# Patient Record
Sex: Male | Born: 1970
Health system: Southern US, Community
[De-identification: ages and names within clinical notes are randomized; demographics above are authoritative.]

## PROBLEM LIST (undated history)

## (undated) DIAGNOSIS — M549 Dorsalgia, unspecified: Secondary | ICD-10-CM

## (undated) HISTORY — PX: KNEE ARTHROSCOPY W/ ACL RECONSTRUCTION AND PATELLA GRAFT: SHX1861

## (undated) HISTORY — PX: LUMBAR DISC SURGERY: SHX700

---

## 2000-01-01 ENCOUNTER — Encounter: Payer: Self-pay | Admitting: Neurosurgery

## 2000-01-01 ENCOUNTER — Ambulatory Visit (HOSPITAL_COMMUNITY): Admission: RE | Admit: 2000-01-01 | Discharge: 2000-01-01 | Payer: Self-pay | Admitting: Neurosurgery

## 2000-03-06 ENCOUNTER — Encounter: Admission: RE | Admit: 2000-03-06 | Discharge: 2000-03-20 | Payer: Self-pay | Admitting: Anesthesiology

## 2000-09-04 ENCOUNTER — Encounter: Admission: RE | Admit: 2000-09-04 | Discharge: 2000-12-03 | Payer: Self-pay | Admitting: Anesthesiology

## 2003-06-14 ENCOUNTER — Encounter: Payer: Self-pay | Admitting: Neurosurgery

## 2003-06-14 ENCOUNTER — Ambulatory Visit (HOSPITAL_COMMUNITY): Admission: RE | Admit: 2003-06-14 | Discharge: 2003-06-15 | Payer: Self-pay | Admitting: Neurosurgery

## 2008-11-30 ENCOUNTER — Emergency Department (HOSPITAL_COMMUNITY): Admission: EM | Admit: 2008-11-30 | Discharge: 2008-12-01 | Payer: Self-pay | Admitting: Emergency Medicine

## 2009-07-07 IMAGING — CR DG FINGER LITTLE 2+V*L*
3 series · 3 of 3 positions shown · non-contrast
Comparison: Left small finger x-rays earlier 5160 hours.

CLINICAL DATA: Reduction of dislocated PIP joint of small finger.

LEFT LITTLE FINGER 2+V [DATE]/4001 4434 hours:

[x finger pa left]
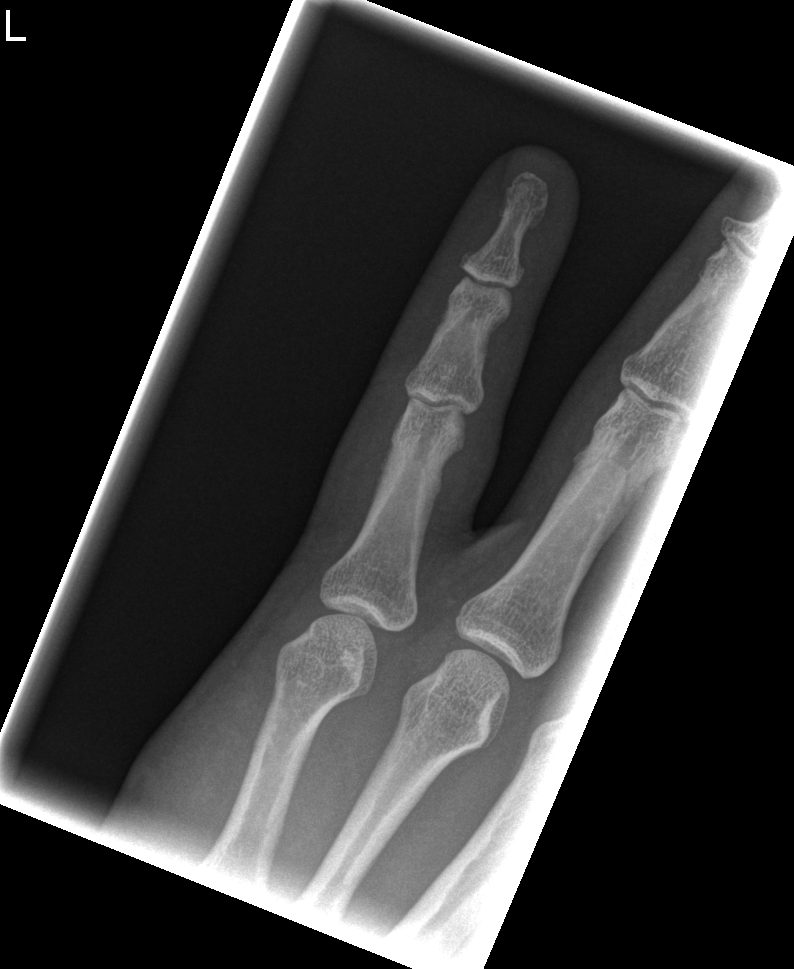

[x finger obl. left]
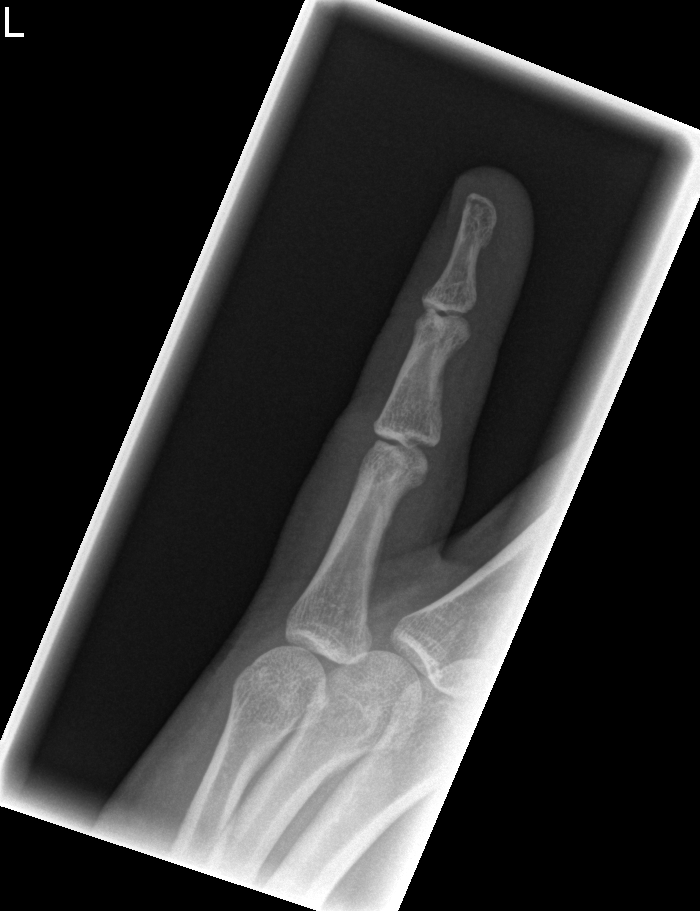

[x finger lateral left]
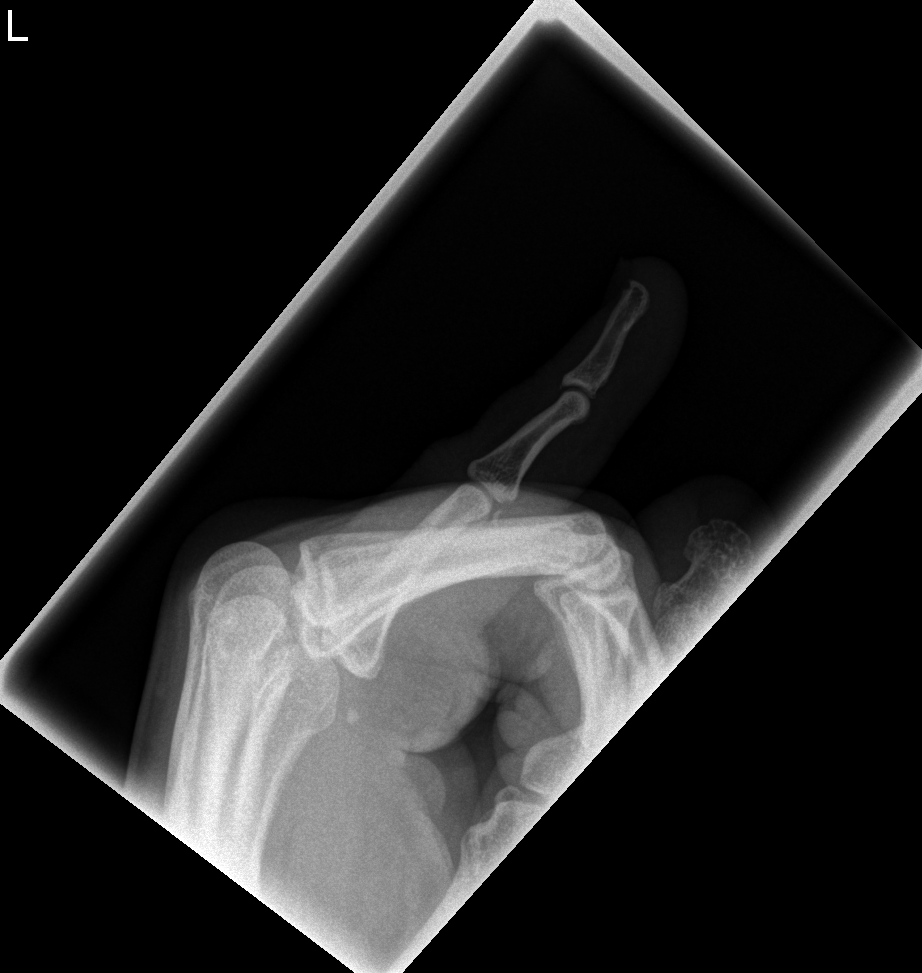

[3 of 3 positions shown; findings below may reference images not displayed]

FINDINGS: Anatomic alignment of the PIP joint after reduction.
Fracture involving the volar aspect of the base of the middle
phalanx, with the fragment just below the joint space level.
IMPRESSION: Anatomic alignment of the PIP joint after reduction.  Fracture
involving the volar aspect of the base of the middle phalanx.

## 2009-10-24 ENCOUNTER — Emergency Department (HOSPITAL_COMMUNITY): Admission: EM | Admit: 2009-10-24 | Discharge: 2009-10-24 | Payer: Self-pay | Admitting: Family Medicine

## 2010-07-01 ENCOUNTER — Emergency Department (HOSPITAL_COMMUNITY): Admission: EM | Admit: 2010-07-01 | Discharge: 2010-07-01 | Payer: Self-pay | Admitting: Emergency Medicine

## 2011-04-03 LAB — DIFFERENTIAL
Basophils Absolute: 0.1 10*3/uL (ref 0.0–0.1)
Basophils Relative: 0 % (ref 0–1)
Eosinophils Absolute: 0.1 10*3/uL (ref 0.0–0.7)
Eosinophils Relative: 1 % (ref 0–5)
Lymphocytes Relative: 16 % (ref 12–46)
Monocytes Absolute: 1 10*3/uL (ref 0.1–1.0)

## 2011-04-03 LAB — CBC
HCT: 39.2 % (ref 39.0–52.0)
Hemoglobin: 13.4 g/dL (ref 13.0–17.0)
MCHC: 34.3 g/dL (ref 30.0–36.0)
MCV: 91.7 fL (ref 78.0–100.0)
Platelets: 219 10*3/uL (ref 150–400)
RDW: 13.8 % (ref 11.5–15.5)

## 2011-05-16 NOTE — Op Note (Signed)
NAME:  Dillon Santiago, Dillon Santiago                       ACCOUNT NO.:  [id number]   MEDICAL RECORD NO.:  [id number]                   PATIENT TYPE:  OIB   LOCATION:  3009                                 FACILITY:  [facility]   PHYSICIAN:  Coletta Memos, M.D.                  DATE OF BIRTH:  [birth year]   DATE OF PROCEDURE:  06/14/2003  DATE OF DISCHARGE:                                 OPERATIVE REPORT   PREOPERATIVE DIAGNOSES:  1. Displaced disk, left L5-S1, left L4-5.  2. Lumbar radiculopathy.  3. Degenerative disk disease, lumbar spine.   POSTOPERATIVE DIAGNOSES:  1. Displaced disk, left L5-S1, left L4-5.  2. Lumbar radiculopathy.  3. Degenerative disk disease, lumbar spine.   PROCEDURE:  Left L4-5 hemilaminectomy and diskectomy, left L5-S1 diskectomy,  both with microdissection.   COMPLICATIONS:  None.   SURGEON:  Coletta Memos, M.D.   ASSISTANTChanning Mutters.   ANESTHESIA:  General endotracheal.   INDICATIONS:  Dillon Santiago is a 40 year old gentleman who presents with  pain in the lower extremities and back on the left side. His pain was such  that I thought it was certainly being caused by disk herniation. I therefore  recommended and he agreed undergo a lumbar laminectomy diskectomy to two  levels.   OPERATIVE NOTE:  Dillon Santiago was brought to the operating room, intubated,  and placed under general anesthesia without difficulty. He was placed in a  Wilson frame and all pressure points were properly padded. Skin was prepped,  and he was draped in a sterile fashion. Infiltrated with 0.5% lidocaine  underneath the skin, approximately 10 mL.  I then made my skin incision  using a #10 blade and took this down to the thoracolumbar fascia. I then  opened a semicircle flap encompassing the spinous processes of L4 to the  sacrum. I reflected that medially. Then using the Cobb curets, I exposed the  laminae of L4-L5 and of S1 in a subperiosteal fashion. I had an x-ray  performed showing that  I was in the correct intralaminar space at L4-5.  Using that as a guide at L3-4, I then moved down one level. Using that as a  guide, I then performed a hemilaminectomy and diskectomy at L4-5.  Retracted  the __________ stats medially and, with the microscope in position using  microdissection, I retracted that and opened the disk space with a #15  blade. I then removed a significant amount of disk material from the disk  space and much more laterally in the disk space to decompress the L4 root.  The L5 root really did not seem to be under much pressure, but the L4 root  and neural foramina clearly was under pressure, as most of the disk did come  out from the lateral aspect. I was satisfied, however, with my  decompression. I then placed some __________ solution in that area and moved  down to  L5-S1. There, I did not perform a laminectomy but was able to remove  the ligamentum flavum and expose the thecal sac. There, the S1 root was  clearly under significant amount of pressure. I retracted medially and  removed a good deal of disk material with Dr. Temple Pacini assistance and  microscopic dissection.  When I was satisfied with that decompression, I then irrigated the wound.  There was no untoward or unusual findings at either level. I then closed the  wound in layered fashion, reapproximated by semicircular flap, then the  subcutaneous tissue and skin edges.  Dermabond was used for a sterile  dressing.                                               Coletta Memos, M.D.    KC/MEDQ  D:  06/15/2003  T:  06/15/2003  Job:  [id number]

## 2011-05-16 NOTE — Procedures (Signed)
[facility]  Patient:    Dillon Santiago, Dillon Santiago                    MRN: [id number] Proc. Date: 09/11/00 Adm. Date:  [id number] Attending:  Thyra Breed CC:         Lillia Dallas. Murray Hodgkins, M.D.  Jearld Adjutant, M.D.   Procedure Report  PREOPERATIVE DIAGNOSIS:  Herniated nucleus pulposus at L5-S1 to the left with underlying degenerative disk disease with some mild spinal stenosis.  POSTOPERATIVE DIAGNOSIS:  Herniated nucleus pulposus at L5-S1 to the left with underlying degenerative disk disease with some mild spinal stenosis.  PROCEDURE:  Lumbar epidural steroid ejection.  SURGEON:  Thyra Breed, M.D.  INTERVAL HISTORY:  The patient has noted mild improvement although he rates his pain at 3 out of 10.  He did note that his 51-year-old son jumped on his back and this may have increased his discomfort to a degree.  He did have an episode on Saturday, six days ago, where he felt like he did not have as good of control of his bowels, but subsequently has had good control of his bowels.  MEDICATIONS:  Unchanged.  PHYSICAL EXAMINATION:  Blood pressure 137/82, heart rate 112, respiratory rate 16, and O2 saturations 95%.  Temperature is 97.8 and pain level is 3 out of 10.  His back shows good healing from his previous injection site.  Neurologic exam is unchanged.  DESCRIPTION OF PROCEDURE:  After informed consent was obtained, the patient was placed in the sitting position and monitored.  His back was prepped with Betadine x 3.  The skin level was raised at the L4-5 interspace with 1% lidocaine.  A 20-gauge Tuohy needle was introduced to the lumbar epidural space with loss of resistance to preservative-free normal saline.  There is no CSF nor blood.  Eighty milligrams of Medrol and 10 ml of preservative-free normal saline was gently injected.  The needle was flushed with preservative-free normal saline and removed intact.  POSTPROCEDURE CONDITION:   Stable.  DISCHARGE INSTRUCTIONS: 1. Resume previous diet. 2. Limitation and activities per instruction sheet. 3. Continue with current medications. 4. The patient plans to follow up with Dr. Callie Fielding. DD:  09/11/00 TD:  09/14/00 Job: [id number]

## 2011-05-16 NOTE — Procedures (Signed)
[facility]  Patient:    Dillon Santiago, Dillon Santiago                    MRN: [id number] Proc. Date: 09/04/00 Adm. Date:  [id number] Attending:  Thyra Breed CC:         Lillia Dallas. Murray Hodgkins, M.D.  Jearld Adjutant, M.D.   Procedure Report  PROCEDURE:  Lumbar epidural steroid injection.  DIAGNOSIS:  Herniated nucleus pulposus at L5-S1 to the left and underlying degenerative disk disease with some mild spinal stenosis.  INTERVAL HISTORY:  The patient was last seen in March at which time he received 1 epidural steroid injection. He followed up with Dr. Murray Hodgkins and then Dr. Renae Fickle who has recommended that he go through another 2 lumbar epidural steroid injections and consideration for an IDEG procedure be considered if this is not successful.  The patient has continued on Celebrex and notes that it partially helps his pain.  The patient notes that his pain is exacerbated by lifting, carrying or prolonged sitting or standing. It is improved by rest.  He continues to have a numbness and tingling out into his left lower extremity as much as previously.  PHYSICAL EXAMINATION:  VITAL SIGNS:  Blood pressure is 123/77, heart rate 64, respiratory rate 12, O2 saturations 100%, pain level is 2-3/10 and temperature is 97.8.  EXTREMITIES:  He exhibits minimal tenderness of his back to palpation over the facet joints.  Hyperextension and forward flexion do not exacerbate his discomfort. Straight leg raise signs are negative today.  NEUROLOGIC:  Otherwise unchanged from his visit in March of 2001.  DESCRIPTION OF PROCEDURE:  After informed consent was obtained, the patient was placed in the sitting position and monitored. The patients back was prepped with Betadine x 3. A skin wheal was raised at the L4-5 interspace with 1 percent lidocaine. A 20 gauge Tuohy needle was introduced to the lumbar epidural space to loss of resistance to preservative free normal saline.  There was no cerebrospinal fluid nor blood. 80 mg of Medrol and 10 ml of preservative free normal saline was injected. The needle was flushed with preservative free normal saline and removed intact.  CONDITION POST PROCEDURE:  Stable.  DISCHARGE INSTRUCTIONS:  Resume previous diet. Limitations in activities per instruction sheet. Continue on current medications. The patient plans to follow-up in 1-2 weeks for a second injection. DD:  09/04/00 TD:  09/05/00 Job: [id number]

## 2011-12-29 ENCOUNTER — Emergency Department (HOSPITAL_COMMUNITY)
Admission: EM | Admit: 2011-12-29 | Discharge: 2011-12-29 | Disposition: A | Discharge status: Home / Self Care | Payer: Managed Care, Other (non HMO) | Source: Home / Self Care

## 2011-12-29 ENCOUNTER — Encounter: Payer: Self-pay | Admitting: *Deleted

## 2011-12-29 DIAGNOSIS — B9789 Other viral agents as the cause of diseases classified elsewhere: Secondary | ICD-10-CM

## 2011-12-29 DIAGNOSIS — B349 Viral infection, unspecified: Secondary | ICD-10-CM

## 2011-12-29 NOTE — ED Provider Notes (Signed)
History     CSN: [id number]  Arrival date & time 12/29/11  0806   None     Chief Complaint  Patient presents with  . Fever    (Consider location/radiation/quality/duration/timing/severity/associated sxs/prior treatment) HPI Comments: Pt states he awoke this morning with a fever of 101.9 and minimal nonproductive cough. No other symptoms. No ear pain, sore throat, myalgias, N/V/D or abd pain.  He has taken an over the counter fever reducing medication. He flew back from [place] yesterday, and felt fine but was tired last night when he went to bed from traveling.   The history is provided by the patient.    History reviewed. No pertinent past medical history.  Past Surgical History  Procedure Date  . Knee arthroscopy w/ acl reconstruction and patella graft   . Lumbar disc surgery     History reviewed. No pertinent family history.  History  Substance Use Topics  . Smoking status: Never Smoker   . Smokeless tobacco: Not on file  . Alcohol Use: Yes     socially      Review of Systems  Constitutional: Positive for fever. Negative for chills and fatigue.  HENT: Negative for ear pain, sore throat, rhinorrhea, sneezing, postnasal drip and sinus pressure.   Respiratory: Positive for cough. Negative for shortness of breath and wheezing.   Cardiovascular: Negative for chest pain and palpitations.  Gastrointestinal: Negative for nausea, vomiting, abdominal pain and diarrhea.  Neurological: Negative for headaches.    Allergies  Review of patient's allergies indicates no known allergies.  Home Medications  No current outpatient prescriptions on file.  BP 150/94  Pulse 83  Temp(Src) 100 F (37.8 C) (Oral)  Resp 18  SpO2 98%  Physical Exam  Nursing note and vitals reviewed. Constitutional: He appears well-developed and well-nourished. No distress.  HENT:  Head: Normocephalic and atraumatic.  Right Ear: Tympanic membrane, external ear and ear canal normal.  Left Ear:  Tympanic membrane, external ear and ear canal normal.  Nose: Nose normal.  Mouth/Throat: Uvula is midline, oropharynx is clear and moist and mucous membranes are normal. No oropharyngeal exudate, posterior oropharyngeal edema or posterior oropharyngeal erythema.  Neck: Neck supple.  Cardiovascular: Normal rate, regular rhythm and normal heart sounds.   Pulmonary/Chest: Effort normal and breath sounds normal. No respiratory distress.  Lymphadenopathy:    He has no cervical adenopathy.  Neurological: He is alert.  Skin: Skin is warm and dry.  Psychiatric: He has a normal mood and affect.    ED Course  Procedures (including critical care time)  Labs Reviewed - No data to display No results found.   1. Viral illness       MDM  Fever and minimal cough - onset this morning. Neg exam.         Melody Comas, [place] 12/29/11 [phone]

## 2011-12-29 NOTE — ED Provider Notes (Signed)
Medical screening examination/treatment/procedure(s) were performed by non-physician practitioner and as supervising physician I was immediately available for consultation/collaboration.  Raynald Blend, MD 12/29/11 1048

## 2011-12-29 NOTE — ED Notes (Signed)
Pt states he woke up with a fever of 101.9 this am.  States he's had a slight cough, denies any other sx.

## 2011-12-30 ENCOUNTER — Telehealth (HOSPITAL_COMMUNITY): Payer: Self-pay | Admitting: *Deleted

## 2011-12-30 NOTE — ED Notes (Signed)
Order obtained from [facility] for Tamiflu 75 mg. #10 1 BID x 5 days- no refills. Tell pt. the side effects are N and V. If he gets that to stop the medicine and there is no alternative.  It will shorten the length of the illness by 1 day if Influenza A. It will not work if it is another strain of flu. Pt. given this information and still wants the Rx. I called [facility] on [address] and they are out of it. She [facility]  [address] because [facility] have a larger supply.  Rx. [facility] [address] @ [phone]. I called pt. back to tell him where to pick up the Rx. Dillon Santiago 12/30/2011

## 2012-10-19 ENCOUNTER — Other Ambulatory Visit: Payer: Self-pay | Admitting: Family Medicine

## 2012-10-19 DIAGNOSIS — R220 Localized swelling, mass and lump, head: Secondary | ICD-10-CM

## 2012-10-21 ENCOUNTER — Ambulatory Visit
Admission: RE | Admit: 2012-10-21 | Discharge: 2012-10-21 | Disposition: A | Discharge status: Home / Self Care | Payer: Managed Care, Other (non HMO) | Source: Ambulatory Visit | Attending: Family Medicine | Admitting: Family Medicine

## 2012-10-21 DIAGNOSIS — R221 Localized swelling, mass and lump, neck: Secondary | ICD-10-CM

## 2019-02-11 DIAGNOSIS — M109 Gout, unspecified: Secondary | ICD-10-CM | POA: Diagnosis not present

## 2019-11-09 DIAGNOSIS — H40053 Ocular hypertension, bilateral: Secondary | ICD-10-CM | POA: Diagnosis not present

## 2020-06-19 ENCOUNTER — Emergency Department (HOSPITAL_COMMUNITY)
Admission: EM | Admit: 2020-06-19 | Discharge: 2020-06-19 | Disposition: A | Discharge status: Home / Self Care | Payer: BC Managed Care – PPO | Attending: Emergency Medicine | Admitting: Emergency Medicine

## 2020-06-19 ENCOUNTER — Encounter: Payer: Self-pay | Admitting: Emergency Medicine

## 2020-06-19 ENCOUNTER — Ambulatory Visit (INDEPENDENT_AMBULATORY_CARE_PROVIDER_SITE_OTHER)
Admission: EM | Admit: 2020-06-19 | Discharge: 2020-06-19 | Disposition: A | Discharge status: Home / Self Care | Payer: BC Managed Care – PPO | Source: Home / Self Care

## 2020-06-19 ENCOUNTER — Emergency Department (HOSPITAL_COMMUNITY): Payer: BC Managed Care – PPO

## 2020-06-19 ENCOUNTER — Other Ambulatory Visit: Payer: Self-pay

## 2020-06-19 ENCOUNTER — Encounter (HOSPITAL_COMMUNITY): Payer: Self-pay | Admitting: Emergency Medicine

## 2020-06-19 DIAGNOSIS — R079 Chest pain, unspecified: Secondary | ICD-10-CM

## 2020-06-19 DIAGNOSIS — I10 Essential (primary) hypertension: Secondary | ICD-10-CM | POA: Diagnosis not present

## 2020-06-19 DIAGNOSIS — Z79899 Other long term (current) drug therapy: Secondary | ICD-10-CM | POA: Insufficient documentation

## 2020-06-19 DIAGNOSIS — E1165 Type 2 diabetes mellitus with hyperglycemia: Secondary | ICD-10-CM | POA: Diagnosis not present

## 2020-06-19 DIAGNOSIS — R1013 Epigastric pain: Secondary | ICD-10-CM | POA: Insufficient documentation

## 2020-06-19 DIAGNOSIS — M549 Dorsalgia, unspecified: Secondary | ICD-10-CM | POA: Insufficient documentation

## 2020-06-19 DIAGNOSIS — R0789 Other chest pain: Secondary | ICD-10-CM | POA: Diagnosis not present

## 2020-06-19 DIAGNOSIS — R739 Hyperglycemia, unspecified: Secondary | ICD-10-CM

## 2020-06-19 DIAGNOSIS — F17210 Nicotine dependence, cigarettes, uncomplicated: Secondary | ICD-10-CM | POA: Diagnosis not present

## 2020-06-19 LAB — BASIC METABOLIC PANEL
Anion gap: 13 (ref 5–15)
BUN: 14 mg/dL (ref 6–20)
CO2: 21 mmol/L — ABNORMAL LOW (ref 22–32)
Calcium: 9.2 mg/dL (ref 8.9–10.3)
Chloride: 102 mmol/L (ref 98–111)
Creatinine, Ser: 1.32 mg/dL — ABNORMAL HIGH (ref 0.61–1.24)
GFR calc Af Amer: >60 mL/min (ref >60)
GFR calc non Af Amer: >60 mL/min (ref >60)
Glucose, Bld: 276 mg/dL — ABNORMAL HIGH (ref 70–99)
Potassium: 4 mmol/L (ref 3.5–5.1)
Sodium: 136 mmol/L (ref 135–145)

## 2020-06-19 LAB — HEPATIC FUNCTION PANEL
ALT: 43 U/L (ref 0–44)
AST: 27 U/L (ref 15–41)
Albumin: 3.7 g/dL (ref 3.5–5.0)
Alkaline Phosphatase: 63 U/L (ref 38–126)
Bilirubin, Direct: 0.2 mg/dL (ref 0.0–0.2)
Indirect Bilirubin: 0.8 mg/dL (ref 0.3–0.9)
Total Bilirubin: 1 mg/dL (ref 0.3–1.2)
Total Protein: 7.1 g/dL (ref 6.5–8.1)

## 2020-06-19 LAB — CBC
HCT: 44.8 % (ref 39.0–52.0)
Hemoglobin: 14.6 g/dL (ref 13.0–17.0)
MCH: 29.9 pg (ref 26.0–34.0)
MCHC: 32.6 g/dL (ref 30.0–36.0)
MCV: 91.6 fL (ref 80.0–100.0)
Platelets: 252 10*3/uL (ref 150–400)
RBC: 4.89 MIL/uL (ref 4.22–5.81)
RDW: 13.2 % (ref 11.5–15.5)
WBC: 13.1 10*3/uL — ABNORMAL HIGH (ref 4.0–10.5)
nRBC: 0 % (ref 0.0–0.2)

## 2020-06-19 LAB — LIPASE, BLOOD: Lipase: 32 U/L (ref 11–51)

## 2020-06-19 LAB — TROPONIN I (HIGH SENSITIVITY)
Troponin I (High Sensitivity): 5 ng/L (ref <18)
Troponin I (High Sensitivity): 6 ng/L (ref <18)

## 2020-06-19 MED ORDER — HYDROMORPHONE HCL 1 MG/ML IJ SOLN
0.5000 mg | Freq: Once | INTRAMUSCULAR | Status: AC
  Administered 2020-06-19: 0.5 mg via INTRAVENOUS
  Filled 2020-06-19: qty 1

## 2020-06-19 MED ORDER — LISINOPRIL 10 MG PO TABS
10.0000 mg | ORAL_TABLET | Freq: Once | ORAL | Status: AC
  Administered 2020-06-19: 10 mg via ORAL
  Filled 2020-06-19: qty 1

## 2020-06-19 MED ORDER — FAMOTIDINE IN NACL 20-0.9 MG/50ML-% IV SOLN
20.0000 mg | Freq: Once | INTRAVENOUS | Status: AC
  Administered 2020-06-19: 20 mg via INTRAVENOUS
  Filled 2020-06-19: qty 50

## 2020-06-19 MED ORDER — MELATONIN 3 MG PO TABS: 3.0000 mg | ORAL_TABLET | Freq: Every evening | ORAL | 0 refills | Status: DC | PRN

## 2020-06-19 MED ORDER — OMEPRAZOLE 20 MG PO CPDR: 20.0000 mg | DELAYED_RELEASE_CAPSULE | Freq: Two times a day (BID) | ORAL | 0 refills | Status: DC

## 2020-06-19 MED ORDER — NITROGLYCERIN 0.4 MG SL SUBL: 0.4000 mg | SUBLINGUAL_TABLET | SUBLINGUAL | Status: DC | PRN

## 2020-06-19 MED ORDER — ALUM & MAG HYDROXIDE-SIMETH 200-200-20 MG/5ML PO SUSP
30.0000 mL | Freq: Once | ORAL | Status: AC
  Administered 2020-06-19: 30 mL via ORAL
  Filled 2020-06-19: qty 30

## 2020-06-19 MED ORDER — SODIUM CHLORIDE 0.9% FLUSH
3.0000 mL | Freq: Once | INTRAVENOUS | Status: AC
  Administered 2020-06-19: 3 mL via INTRAVENOUS

## 2020-06-19 MED ORDER — LIDOCAINE VISCOUS HCL 2 % MT SOLN
15.0000 mL | Freq: Once | OROMUCOSAL | Status: AC
  Administered 2020-06-19: 15 mL via ORAL
  Filled 2020-06-19: qty 15

## 2020-06-19 MED ORDER — ASPIRIN 81 MG PO CHEW
81.0000 mg | CHEWABLE_TABLET | Freq: Once | ORAL | Status: AC
  Administered 2020-06-19: 81 mg via ORAL
  Filled 2020-06-19: qty 1

## 2020-06-19 MED ORDER — ACETAMINOPHEN 500 MG PO TABS: 500.0000 mg | ORAL_TABLET | Freq: Four times a day (QID) | ORAL | 0 refills | Status: DC | PRN

## 2020-06-19 MED ORDER — SUCRALFATE 1 G PO TABS
1.0000 g | ORAL_TABLET | Freq: Three times a day (TID) | ORAL | 0 refills | Status: DC
Start: 2020-06-19 — End: 2021-11-08

## 2020-06-19 MED ORDER — LISINOPRIL 10 MG PO TABS: 10.0000 mg | ORAL_TABLET | Freq: Every day | ORAL | 2 refills | Status: DC

## 2020-06-19 MED ORDER — KETOROLAC TROMETHAMINE 30 MG/ML IJ SOLN
30.0000 mg | Freq: Once | INTRAMUSCULAR | Status: AC
  Administered 2020-06-19: 30 mg via INTRAVENOUS
  Filled 2020-06-19: qty 1

## 2020-06-19 NOTE — ED Notes (Signed)
Pt requesting pain medication before discharge. PA notified

## 2020-06-19 NOTE — ED Provider Notes (Signed)
[facility] EMERGENCY DEPARTMENT Provider Note   CSN: [id number] Arrival date & time: 06/19/20  1048     History Chief Complaint  Patient presents with  . Chest Pain  . Back Pain    Dillon Santiago is a 49 y.o. male with history of hypertension. "pre-diabetes", presents for evaluation of acute onset, persistent epigastric and chest pain noted upon awakening at 4 AM.  He reports the pain woke him from his sleep yesterday morning.  The pain is constant, dull but sharp at times.  It radiates straight through to the back.  He denies associated shortness of breath, nausea, vomiting, diarrhea, urinary symptoms.  Pain worsens laying flat and he states that it has been keeping him up at night and he has been unable to sleep.  It is not related to meals.  He has tried Tums and Gas-X without relief of symptoms.  He is a current smoker.  He drinks 3 times weekly, 3-4 liquor drinks/6-7 beers.  Denies recreational drug use.  He reports that he was previously on medications for hypertension and prediabetes but that he lost weight and was able to get off of the medications.  He states that since then he has gained weight and has not been on the medications again.  He has not seen his PCP in a few years.  He went to urgent care and was sent to the ED for further evaluation and management. The history is provided by the patient.       History reviewed. No pertinent past medical history.  There are no problems to display for this patient.   Past Surgical History:  Procedure Laterality Date  . KNEE ARTHROSCOPY W/ ACL RECONSTRUCTION AND PATELLA GRAFT    . LUMBAR DISC SURGERY         Family History  Problem Relation Age of Onset  . Kidney failure Father   . Congestive Heart Failure Father     Social History   Tobacco Use  . Smoking status: Current Every Day Smoker  . Smokeless tobacco: Never Used  Substance Use Topics  . Alcohol use: Yes    Comment: socially  . Drug  use: No    Home Medications Prior to Admission medications   Medication Sig Start Date End Date Taking? Authorizing Provider  calcium carbonate (TUMS EX) 750 MG chewable tablet Chew 1-2 tablets by mouth as needed for heartburn.   Yes [provider]  ibuprofen (ADVIL) 200 MG tablet Take 400-600 mg by mouth every 6 (six) hours as needed for headache or mild pain.   Yes [provider]  simethicone (MYLICON) 125 MG chewable tablet Chew 125 mg by mouth every 6 (six) hours as needed for flatulence.   Yes [provider]  acetaminophen (TYLENOL) 500 MG tablet Take 1 tablet (500 mg total) by mouth every 6 (six) hours as needed. 06/19/20   Luevenia Maxin, Lillionna Nabi A, PA-C  lisinopril (ZESTRIL) 10 MG tablet Take 1 tablet (10 mg total) by mouth daily. 06/19/20   Nakiyah Beverley A, PA-C  melatonin 3 MG TABS tablet Take 1 tablet (3 mg total) by mouth at bedtime as needed (sleep). 06/19/20   Luevenia Maxin, Stacy Deshler A, PA-C  omeprazole (PRILOSEC) 20 MG capsule Take 1 capsule (20 mg total) by mouth 2 (two) times daily before a meal. 06/19/20   Miley Lindon A, PA-C  sucralfate (CARAFATE) 1 g tablet Take 1 tablet (1 g total) by mouth 4 (four) times daily -  with meals  and at bedtime. 06/19/20   Michela Pitcher A, PA-C    Allergies    Patient has no known allergies.  Review of Systems   Review of Systems  Constitutional: Negative for chills and fever.  Respiratory: Negative for shortness of breath.   Cardiovascular: Positive for chest pain.  Gastrointestinal: Positive for abdominal pain. Negative for diarrhea, nausea and vomiting.  Neurological: Negative for syncope and light-headedness.  All other systems reviewed and are negative.   Physical Exam Updated Vital Signs BP (!) 155/110 (BP Location: Left Arm)   Pulse 90   Temp 98.7 F (37.1 C) (Oral)   Resp 17   Ht 5\' 10"  (1.778 m)   Wt 115.7 kg   SpO2 94%   BMI 36.59 kg/m   Physical Exam Vitals and nursing note reviewed.  Constitutional:      General:  He is not in acute distress.    Appearance: He is well-developed.  HENT:     Head: Normocephalic and atraumatic.  Eyes:     General:        Right eye: No discharge.        Left eye: No discharge.     Conjunctiva/sclera: Conjunctivae normal.  Neck:     Vascular: No JVD.     Trachea: No tracheal deviation.  Cardiovascular:     Rate and Rhythm: Normal rate and regular rhythm.     Pulses:          Radial pulses are 2+ on the right side and 2+ on the left side.       Dorsalis pedis pulses are 2+ on the right side and 2+ on the left side.       Posterior tibial pulses are 2+ on the right side and 2+ on the left side.     Comments: BP 160/107 in the right upper extremity, 163/98 in the left upper extremity No lower extremity edema, Homans' sign absent bilaterally. Pulmonary:     Effort: Pulmonary effort is normal.  Chest:     Chest wall: No tenderness.  Abdominal:     General: Bowel sounds are normal. There is no distension.     Palpations: Abdomen is soft. There is no mass.     Tenderness: There is no abdominal tenderness.  Musculoskeletal:     Right lower leg: No tenderness. No edema.     Left lower leg: No tenderness. No edema.  Skin:    General: Skin is warm.     Findings: No erythema.  Neurological:     Mental Status: He is alert.  Psychiatric:        Behavior: Behavior normal.     ED Results / Procedures / Treatments   Labs (all labs ordered are listed, but only abnormal results are displayed) Labs Reviewed  BASIC METABOLIC PANEL - Abnormal; Notable for the following components:      Result Value   CO2 21 (*)    Glucose, Bld 276 (*)    Creatinine, Ser 1.32 (*)    All other components within normal limits  CBC - Abnormal; Notable for the following components:   WBC 13.1 (*)    All other components within normal limits  HEPATIC FUNCTION PANEL  LIPASE, BLOOD  TROPONIN I (HIGH SENSITIVITY)  TROPONIN I (HIGH SENSITIVITY)    EKG EKG  Interpretation  Date/Time:  Tuesday June 19 2020 19:10:09 EDT Ventricular Rate:  100 PR Interval:    QRS Duration: 95 QT Interval:  335 QTC Calculation:  432 R Axis:   -36 Text Interpretation: Sinus tachycardia Abnormal R-wave progression, early transition Left ventricular hypertrophy Inferior infarct, old Anterior Q waves, possibly due to LVH No significant change since last tracing Confirmed by Wandra Arthurs [phone]) on 06/19/2020 7:41:56 PM Also confirmed by Wandra Arthurs [phone]), editor Victory Dakin [phone])  on 06/20/2020 7:28:33 AM   Radiology DG Chest 2 View  Result Date: 06/19/2020 CLINICAL DATA:  Chest pain EXAM: CHEST - 2 VIEW COMPARISON:  None. FINDINGS: The heart size and mediastinal contours are within normal limits. Both lungs are clear. The visualized skeletal structures are unremarkable. IMPRESSION: No active cardiopulmonary disease. Electronically Signed   By: Inez Catalina M.D.   On: 06/19/2020 12:42    Procedures Procedures (including critical care time)  Medications Ordered in ED Medications  sodium chloride flush (NS) 0.9 % injection 3 mL (3 mLs Intravenous Given 06/19/20 2030)  aspirin chewable tablet 81 mg (81 mg Oral Given 06/19/20 2004)  alum & mag hydroxide-simeth (MAALOX/MYLANTA) 200-200-20 MG/5ML suspension 30 mL (30 mLs Oral Given 06/19/20 2004)    And  lidocaine (XYLOCAINE) 2 % viscous mouth solution 15 mL (15 mLs Oral Given 06/19/20 2004)  famotidine (PEPCID) IVPB 20 mg premix (0 mg Intravenous Stopped 06/19/20 2050)  lisinopril (ZESTRIL) tablet 10 mg (10 mg Oral Given 06/19/20 2004)  ketorolac (TORADOL) 30 MG/ML injection 30 mg (30 mg Intravenous Given 06/19/20 2300)  HYDROmorphone (DILAUDID) injection 0.5 mg (0.5 mg Intravenous Given 06/19/20 2301)    ED Course  I have reviewed the triage vital signs and the nursing notes.  Pertinent labs & imaging results that were available during my care of the patient were reviewed by me and considered in my medical decision  making (see chart for details).    MDM Rules/Calculators/A&P                          Patient sent from urgent care for evaluation of constant substernal chest pains/epigastric pain that has been present since being noticed yesterday at 4 AM.  He is afebrile, persistently hypertensive in the ED but notes that he is not currently on any antihypertensive medications at home.  He was previously on Metformin and a blood pressure medication but was able to wean himself off of medicines due to lifestyle modifications.  Unfortunately he reports that he has gained weight and thinks that he might need the medicines again.  His abdomen is soft with no rebound or guarding, no concern for acute surgical abdominal pathology.  EKG today notable for normal sinus rhythm, old anterior Q waves possibly due to LVH or previous cardiac abnormalities.  Serial troponins in the ED are negative and he has had symptoms constantly since 4 AM yesterday so in the setting of ACS or MI I would expect to see elevations in the troponin.  Remainder of lab work reviewed and interpreted by myself shows nonspecific leukocytosis, no anemia, mildly elevated creatinine but BUN within normal limits.  He is hyperglycemic but is not in DKA as anion gap is within normal limits.  Lipase and LFTs are within normal limits so I have a low suspicion of pancreatitis or hepatobiliary dysfunction, especially in the setting of reassuring physical examination.  Chest x-ray shows no active cardiopulmonary disease, no evidence of pneumothorax or pneumonia.  Blood pressures were checked in the bilateral upper extremities and were equal bilaterally, low suspicion for dissection at this time.  Doubt PE in the absence  of risk factors, no complaint of shortness of breath.  The pain worsens laying flat, could be due to GERD or peptic ulcer disease.  He had improvement in his symptoms with a GI cocktail and IV Pepcid suggesting this might be the etiology of his  symptoms.  Pain has been controlled in the ED.  On reevaluation patient resting comfortably, appears hemodynamically stable and in no distress.  Doubt esophageal rupture.  We will start him on lisinopril for management of hypertension.  He would like to hold off on any medications for glycemic control as he did have some side effects with Metformin.  I did emphasize the importance of follow-up with his PCP for management of his hyperglycemia and hypertension.  We discussed dietary modifications, lifestyle modifications that could help in the setting of possible reflux disease.  Will start on a course of omeprazole and Carafate.  Discussed strict ED return precautions.  Patient and wife verbalized understanding of and agreement with plan and patient is stable for discharge at this time.  Discussed case with Dr. Silverio Lay who agrees with assessment and plan at this time.   Final Clinical Impression(s) / ED Diagnoses Final diagnoses:  Atypical chest pain  Hypertension, unspecified type  Hyperglycemia    Rx / DC Orders ED Discharge Orders         Ordered    lisinopril (ZESTRIL) 10 MG tablet  Daily     Discontinue  Reprint     06/19/20 2233    melatonin 3 MG TABS tablet  At bedtime PRN     Discontinue  Reprint     06/19/20 2233    acetaminophen (TYLENOL) 500 MG tablet  Every 6 hours PRN     Discontinue  Reprint     06/19/20 2233    omeprazole (PRILOSEC) 20 MG capsule  2 times daily before meals     Discontinue  Reprint     06/19/20 2233    sucralfate (CARAFATE) 1 g tablet  3 times daily with meals & bedtime     Discontinue  Reprint     06/19/20 2233           Jeanie Sewer, PA-C 06/20/20 1507    Charlynne Pander, MD 06/20/20 [phone]

## 2020-06-19 NOTE — Discharge Instructions (Signed)
49 year old male comes in for 2-day history of chest pain/epigastric pain that is radiating to the back/shoulders and is getting worse.  States woke up from the pain 4 AM yesterday, with dull pain to the epigastric area and occasional sharp nests.  Denies associated nausea, vomiting, shortness of breath.  Has had fatty foods throughout the past day without significant change to symptoms.  Denies exertional chest pain, exertional fatigue.  Current everyday smoker, alcohol use every weekend, 3-4 liquor drinks/6-7 beers.  Denies drug use.  Father with CHF, unsure of MI.  Maternal grandmother with possible MI. Tried antacids without relief.  On exam, patient is hypertensive at 165/102, HR 92, RR 22, temp 99.6, O2 95 Constitutional: nontoxic, no acute distress Heart: RRR Lungs: LCTAB Abdomen: Soft, +BS, nontender  EKG NSR, 66bpm, LAD, incomplete RBBB, no acute ST elevation/depression, no prior EKG for comparison.  Discussed with patient, unsure of cause of symptoms. Lower suspicion for abdominal causes given patient has been able to eat fatty foods throughout the day without worsening of symptoms. However, he reports significant symptoms and unable to rest due to pain, which is getting worse. Discharged in stable condition to ED for further evaluation.

## 2020-06-19 NOTE — ED Triage Notes (Signed)
Pt. Stated, I started having lower chest pain that went to my back. I went to UC and they sent me down here to be evaluated.

## 2020-06-19 NOTE — ED Notes (Signed)
Pt up to bathroom at this time

## 2020-06-19 NOTE — ED Provider Notes (Signed)
49 year old male comes in for 2-day history of chest pain/epigastric pain that is radiating to the back/shoulders and is getting worse.  States woke up from the pain 4 AM yesterday, with dull pain to the epigastric area and occasional sharp nests.  Denies associated nausea, vomiting, shortness of breath.  Has had fatty foods throughout the past day without significant change to symptoms.  Denies exertional chest pain, exertional fatigue.  Current everyday smoker, alcohol use every weekend, 3-4 liquor drinks/6-7 beers.  Denies drug use.  Father with CHF, unsure of MI.  Maternal grandmother with possible MI. Tried antacids without relief.  On exam, patient is hypertensive at 165/102, HR 92, RR 22, temp 99.6, O2 95 Constitutional: nontoxic, no acute distress Heart: RRR Lungs: LCTAB Abdomen: Soft, +BS, nontender  EKG NSR, 66bpm, LAD, incomplete RBBB, no acute ST elevation/depression, no prior EKG for comparison.  Discussed with patient, unsure of cause of symptoms. Lower suspicion for abdominal causes given patient has been able to eat fatty foods throughout the day without worsening of symptoms. However, he reports significant symptoms and unable to rest due to pain, which is getting worse. Discharged in stable condition to ED for further evaluation.    Belinda Fisher, PA-C 06/19/20 [phone]

## 2020-06-19 NOTE — ED Notes (Signed)
Pt resting in NAD at this time, no concerns at this time, pt attached to cardiac monitor

## 2020-06-19 NOTE — ED Triage Notes (Signed)
Epigastric pain, through to back, between shoulder blades, started 4 am yesterday.  Described as dull.  Took antacids, gas x -no relief.   No nausea, no vomiting, no sob, unclear on diaphoresis.  Last bm was yesterday-not normal per patient, more loose than usual

## 2020-06-19 NOTE — Discharge Instructions (Signed)
Start taking lisinopril daily in the mornings for blood pressure.  I would recommend checking your blood pressure once daily around the same time every day after taking your medications.  Keep a log of your blood pressures.  Start taking omeprazole twice daily 1 hour before meals ideally.  Take Carafate with meals and at night.  You can take extra strength Tylenol every 6 hours as needed for pain.  Do not exceed more than 4000 mg of Tylenol daily.  Avoid spicy foods, fried foods, acidic foods, or alcohol.  Try not to eat too close to bedtime, I usually recommend 2 hours between your last meal and when you go to bed.  You may also find it helpful to elevate the head of the bed.  Please follow-up with a primary care provider for reevaluation of your symptoms and for recheck of your high blood sugars and high blood pressure.  Call the number on the back of your insurance card or go online to see providers accepting new patients in the area.  Return to the emergency department if any concerning signs or symptoms develop such as high fevers, persistent vomiting, worsening chest pains or shortness of breath.

## 2020-06-21 DIAGNOSIS — Z6836 Body mass index (BMI) 36.0-36.9, adult: Secondary | ICD-10-CM | POA: Diagnosis not present

## 2020-06-21 DIAGNOSIS — Z7689 Persons encountering health services in other specified circumstances: Secondary | ICD-10-CM | POA: Diagnosis not present

## 2020-06-21 DIAGNOSIS — R030 Elevated blood-pressure reading, without diagnosis of hypertension: Secondary | ICD-10-CM | POA: Diagnosis not present

## 2020-06-21 DIAGNOSIS — E1165 Type 2 diabetes mellitus with hyperglycemia: Secondary | ICD-10-CM | POA: Diagnosis not present

## 2020-06-21 DIAGNOSIS — R1013 Epigastric pain: Secondary | ICD-10-CM | POA: Diagnosis not present

## 2020-06-30 DIAGNOSIS — R1013 Epigastric pain: Secondary | ICD-10-CM | POA: Diagnosis not present

## 2020-06-30 DIAGNOSIS — R109 Unspecified abdominal pain: Secondary | ICD-10-CM | POA: Diagnosis not present

## 2020-09-22 DIAGNOSIS — I10 Essential (primary) hypertension: Secondary | ICD-10-CM | POA: Diagnosis not present

## 2020-09-22 DIAGNOSIS — Z1211 Encounter for screening for malignant neoplasm of colon: Secondary | ICD-10-CM | POA: Diagnosis not present

## 2020-09-22 DIAGNOSIS — K219 Gastro-esophageal reflux disease without esophagitis: Secondary | ICD-10-CM | POA: Diagnosis not present

## 2021-01-24 IMAGING — DX DG CHEST 2V
2 series · 2 of 2 positions shown · non-contrast
Comparison: None.

CLINICAL DATA: Chest pain

EXAM:
CHEST - 2 VIEW

[w chest pa]
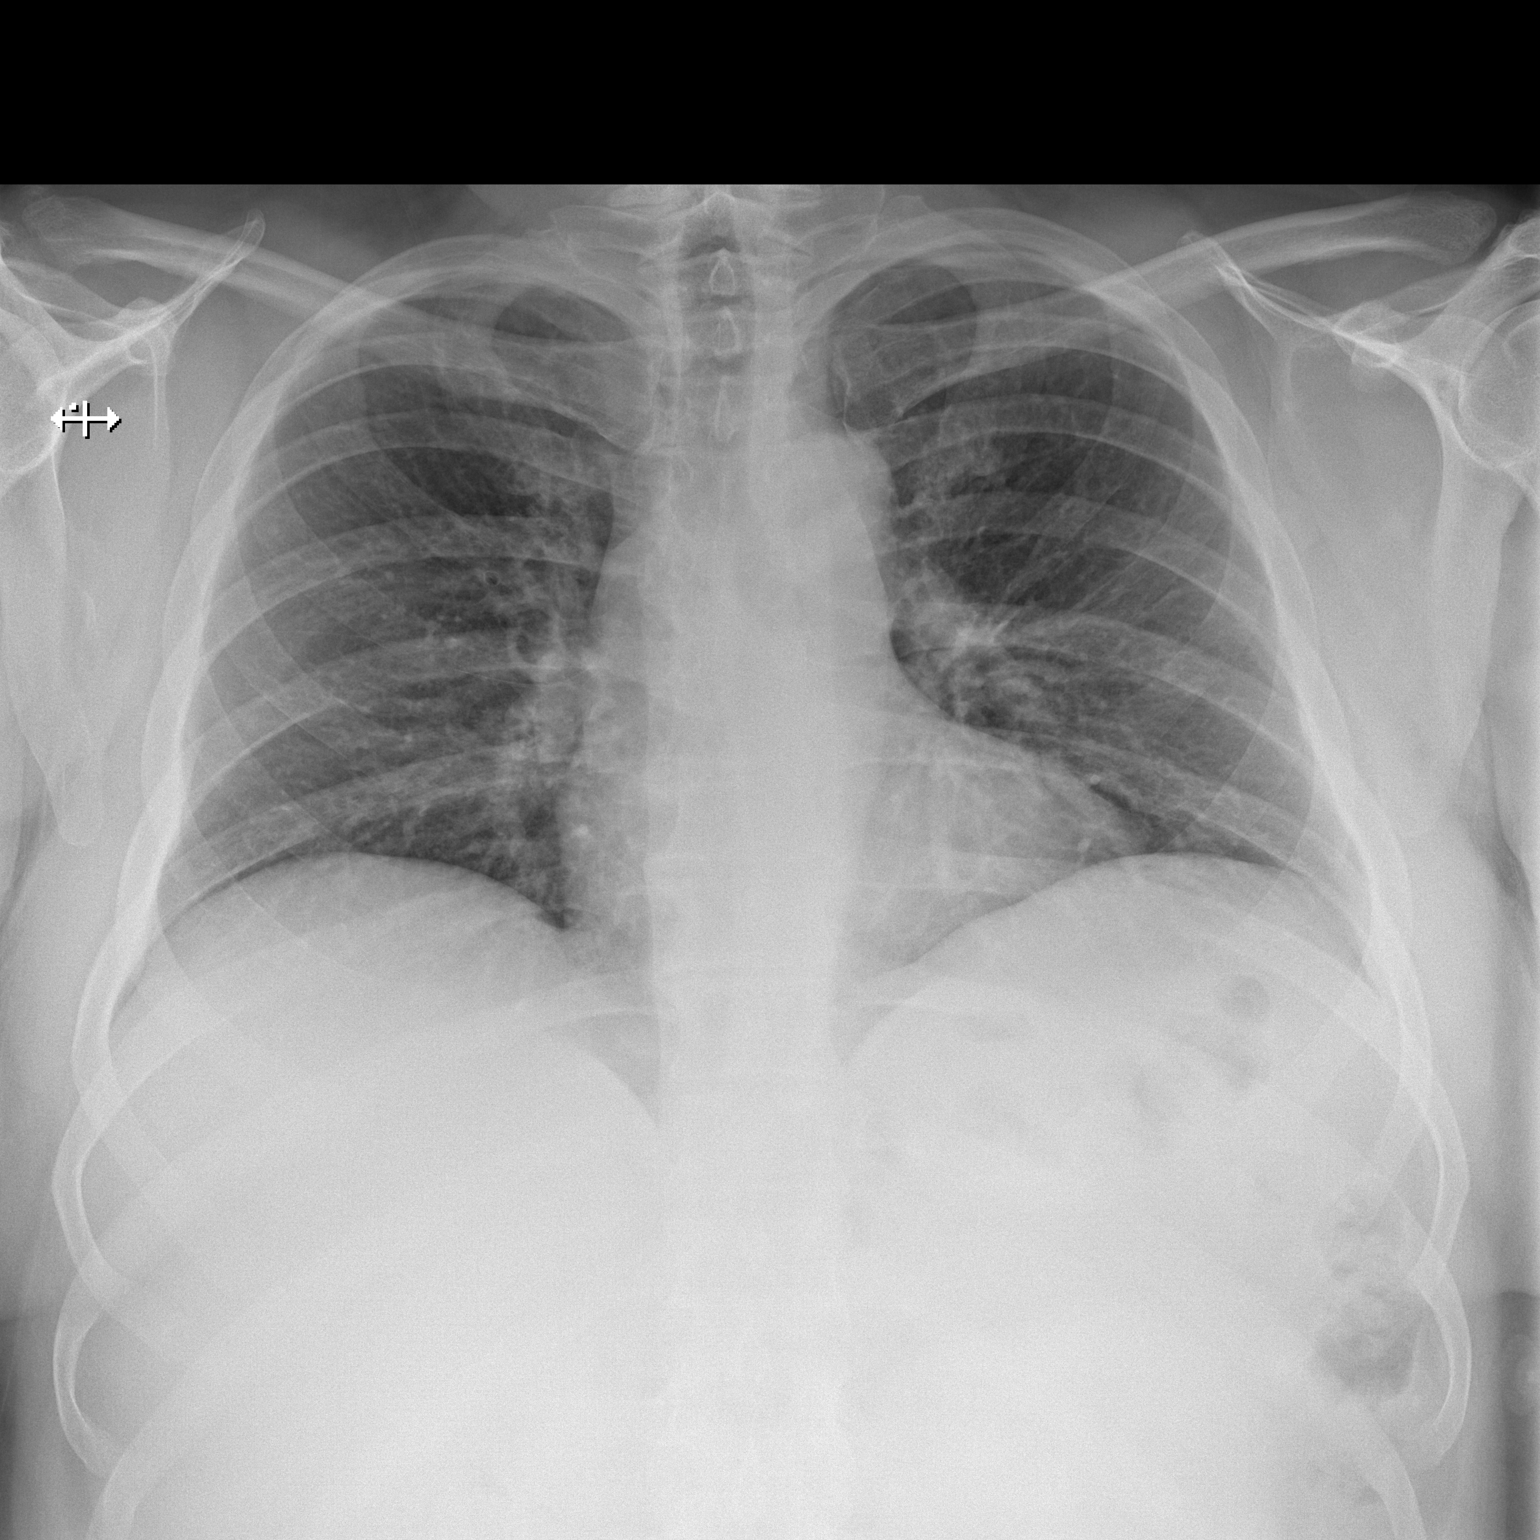

[w chest lat]
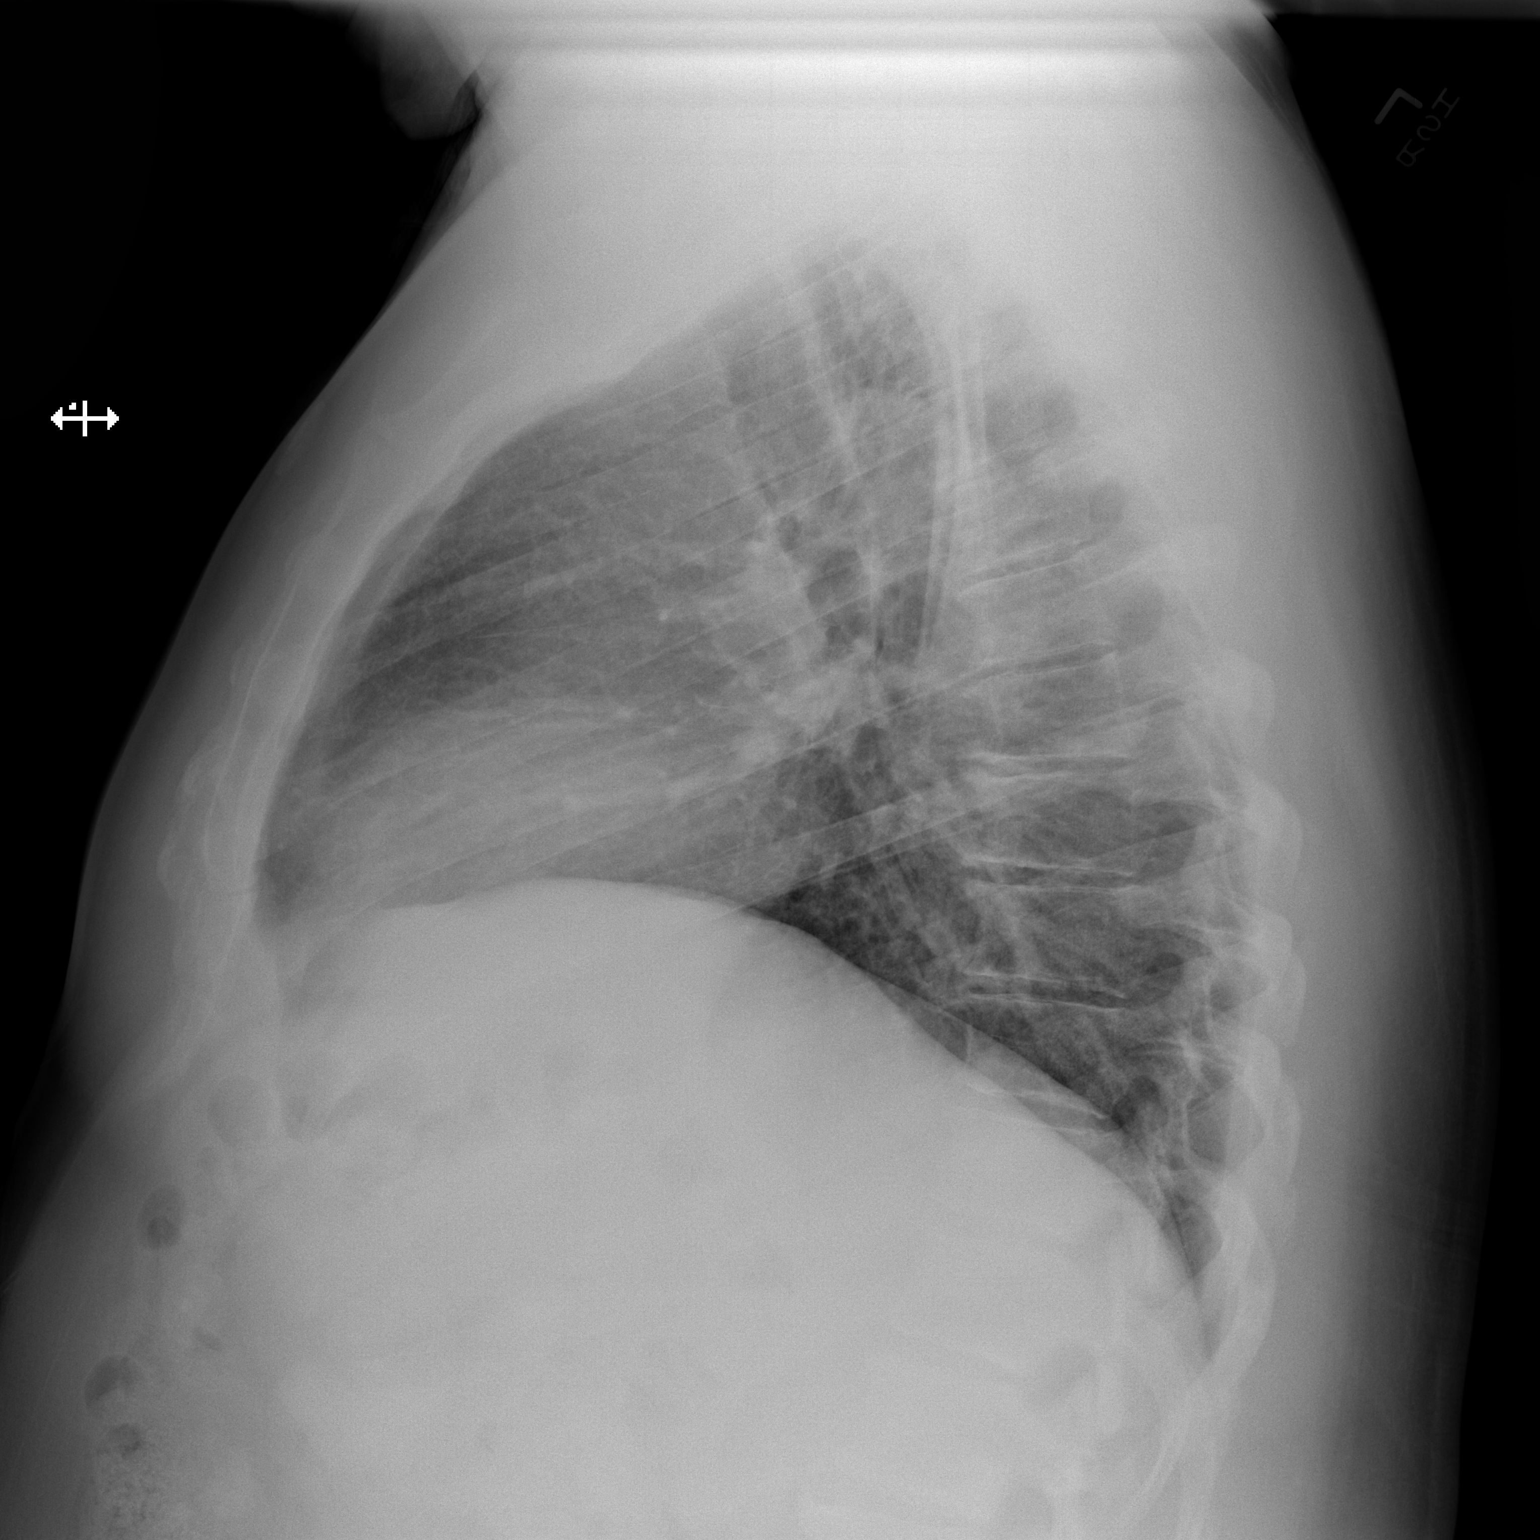

[2 of 2 positions shown; findings below may reference images not displayed]

FINDINGS: The heart size and mediastinal contours are within normal limits.
Both lungs are clear. The visualized skeletal structures are
unremarkable.
IMPRESSION: No active cardiopulmonary disease.

## 2021-05-15 ENCOUNTER — Encounter (HOSPITAL_BASED_OUTPATIENT_CLINIC_OR_DEPARTMENT_OTHER): Payer: Self-pay | Admitting: *Deleted

## 2021-05-15 ENCOUNTER — Emergency Department (HOSPITAL_BASED_OUTPATIENT_CLINIC_OR_DEPARTMENT_OTHER)
Admission: EM | Admit: 2021-05-15 | Discharge: 2021-05-15 | Disposition: A | Discharge status: Home / Self Care | Payer: BC Managed Care – PPO | Attending: Emergency Medicine | Admitting: Emergency Medicine

## 2021-05-15 ENCOUNTER — Emergency Department (HOSPITAL_BASED_OUTPATIENT_CLINIC_OR_DEPARTMENT_OTHER): Payer: BC Managed Care – PPO

## 2021-05-15 ENCOUNTER — Other Ambulatory Visit: Payer: Self-pay

## 2021-05-15 DIAGNOSIS — M5442 Lumbago with sciatica, left side: Secondary | ICD-10-CM | POA: Diagnosis not present

## 2021-05-15 DIAGNOSIS — F17200 Nicotine dependence, unspecified, uncomplicated: Secondary | ICD-10-CM | POA: Diagnosis not present

## 2021-05-15 DIAGNOSIS — M549 Dorsalgia, unspecified: Secondary | ICD-10-CM | POA: Diagnosis present

## 2021-05-15 HISTORY — DX: Dorsalgia, unspecified: M54.9

## 2021-05-15 MED ORDER — OXYCODONE HCL 5 MG PO TABS: 5.0000 mg | ORAL_TABLET | ORAL | 0 refills | Status: DC | PRN

## 2021-05-15 MED ORDER — PREDNISONE 50 MG PO TABS
50.0000 mg | ORAL_TABLET | Freq: Every day | ORAL | 0 refills | Status: AC
Start: 2021-05-15 — End: 2021-05-20

## 2021-05-15 MED ORDER — METHOCARBAMOL 500 MG PO TABS
500.0000 mg | ORAL_TABLET | Freq: Once | ORAL | Status: AC
  Administered 2021-05-15: 500 mg via ORAL
  Filled 2021-05-15: qty 1

## 2021-05-15 MED ORDER — OXYCODONE HCL 5 MG PO TABS: 5.0000 mg | ORAL_TABLET | ORAL | 0 refills | Status: AC | PRN

## 2021-05-15 MED ORDER — PREDNISONE 50 MG PO TABS
60.0000 mg | ORAL_TABLET | Freq: Once | ORAL | Status: AC
  Administered 2021-05-15: 60 mg via ORAL
  Filled 2021-05-15: qty 1

## 2021-05-15 MED ORDER — OXYCODONE-ACETAMINOPHEN 5-325 MG PO TABS
1.0000 | ORAL_TABLET | Freq: Once | ORAL | Status: AC
  Administered 2021-05-15: 1 via ORAL
  Filled 2021-05-15: qty 1

## 2021-05-15 MED ORDER — LIDOCAINE 5 % EX PTCH: 1.0000 | MEDICATED_PATCH | CUTANEOUS | 0 refills | Status: DC

## 2021-05-15 MED ORDER — PREDNISONE 50 MG PO TABS: 50.0000 mg | ORAL_TABLET | Freq: Every day | ORAL | 0 refills | Status: DC

## 2021-05-15 NOTE — Discharge Instructions (Signed)
Follow up with primary care provider if your symptoms are unresolved

## 2021-05-15 NOTE — ED Provider Notes (Signed)
[facility] EMERGENCY DEPARTMENT Provider Note   CSN: [id number] Arrival date & time: 05/15/21  1230     History Chief Complaint  Patient presents with  . Back Pain    Dillon Santiago is a 50 y.o. male.  Dillon Santiago is a 50 y.o. male with past with history significant for back pain who presents for evaluation of back pain.  Pain x3 weeks.  This began after moving something.  He states pain feels similar to when he needed prior lumbar surgery for sciatica.  Pain radiates down his leg.  Worse with movement.  Pain improves when he lays flat.  Was initially given muscle relaxers and pain medicine by his PCP which he states helped.  He finished his last dose of pain medication today.  No fever, chills, nausea, vomiting, chest pain, shortness of breath, IV drug use, bowel or bladder incontinence, saddle paresthesia.  Has additional aggravating or alleviating factors.  Last bowel movement yesterday.  His current pain is a 7/10. Denies additional aggravating or alleviating factors.  History obtained from patient and past medical records.  No interpreter used   HPI     Past Medical History:  Diagnosis Date  . Back pain     There are no problems to display for this patient.   Past Surgical History:  Procedure Laterality Date  . KNEE ARTHROSCOPY W/ ACL RECONSTRUCTION AND PATELLA GRAFT    . LUMBAR DISC SURGERY         Family History  Problem Relation Age of Onset  . Kidney failure Father   . Congestive Heart Failure Father     Social History   Tobacco Use  . Smoking status: Current Every Day Smoker  . Smokeless tobacco: Never Used  Substance Use Topics  . Alcohol use: Yes    Comment: socially  . Drug use: No    Home Medications Prior to Admission medications   Medication Sig Start Date End Date Taking? Authorizing Provider  lidocaine (LIDODERM) 5 % Place 1 patch onto the skin daily. Remove & Discard patch within 12 hours or as directed by MD  05/15/21  Yes Krishna Dancel A, PA-C  oxyCODONE (ROXICODONE) 5 MG immediate release tablet Take 1 tablet (5 mg total) by mouth every 4 (four) hours as needed for up to 3 days for severe pain. 05/15/21 05/18/21 Yes Wania Longstreth A, PA-C  predniSONE (DELTASONE) 50 MG tablet Take 1 tablet (50 mg total) by mouth daily for 5 days. 05/15/21 05/20/21 Yes Amzie Sillas A, PA-C  acetaminophen (TYLENOL) 500 MG tablet Take 1 tablet (500 mg total) by mouth every 6 (six) hours as needed. 06/19/20   Luevenia Maxin, Mina A, PA-C  calcium carbonate (TUMS EX) 750 MG chewable tablet Chew 1-2 tablets by mouth as needed for heartburn.    [provider]  ibuprofen (ADVIL) 200 MG tablet Take 400-600 mg by mouth every 6 (six) hours as needed for headache or mild pain.    [provider]  lisinopril (ZESTRIL) 10 MG tablet Take 1 tablet (10 mg total) by mouth daily. 06/19/20   Fawze, Mina A, PA-C  melatonin 3 MG TABS tablet Take 1 tablet (3 mg total) by mouth at bedtime as needed (sleep). 06/19/20   Luevenia Maxin, Mina A, PA-C  omeprazole (PRILOSEC) 20 MG capsule Take 1 capsule (20 mg total) by mouth 2 (two) times daily before a meal. 06/19/20   Fawze, Mina A, PA-C  simethicone (MYLICON) 125 MG chewable tablet Chew 125 mg  by mouth every 6 (six) hours as needed for flatulence.    [provider]  sucralfate (CARAFATE) 1 g tablet Take 1 tablet (1 g total) by mouth 4 (four) times daily -  with meals and at bedtime. 06/19/20   Michela PitcherFawze, Mina A, PA-C    Allergies    Patient has no known allergies.  Review of Systems   Review of Systems  Constitutional: Negative.   HENT: Negative.   Respiratory: Negative.   Cardiovascular: Negative.   Gastrointestinal: Negative.   Genitourinary: Negative.   Musculoskeletal: Positive for back pain.  Skin: Negative.   Neurological: Negative.   All other systems reviewed and are negative.   Physical Exam Updated Vital Signs BP (!) 156/103 (BP Location: Right Arm)   Pulse 67    Temp 98 F (36.7 C) (Oral)   Resp 20   Ht 5\' 10"  (1.778 m)   Wt 106.6 kg   SpO2 98%   BMI 33.72 kg/m   Physical Exam Physical Exam  Constitutional: Pt appears well-developed and well-nourished. No distress.  HENT:  Head: Normocephalic and atraumatic.  Mouth/Throat: Oropharynx is clear and moist. No oropharyngeal exudate.  Eyes: Conjunctivae are normal.  Neck: Normal range of motion. Neck supple.  Full ROM without pain  Cardiovascular: Normal rate, regular rhythm and intact distal pulses.   Pulmonary/Chest: Effort normal and breath sounds normal. No respiratory distress. Pt has no wheezes.  Abdominal: Soft. Pt exhibits no distension. There is no tenderness, rebound or guarding. No abd bruit or pulsatile mass Musculoskeletal:  Full range of motion of the T-spine and L-spine with flexion, hyperextension, and lateral flexion. No midline tenderness or stepoffs. No tenderness to palpation of the spinous processes of the T-spine or L-spine. Moderate tenderness to palpation of the paraspinous muscles of the LEFT L-spine. Positive straight leg raise on left Lymphadenopathy:    Pt has no cervical adenopathy.  Neurological: Pt is alert. Pt has normal reflexes.  Reflex Scores:      Bicep reflexes are 2+ on the right side and 2+ on the left side.      Brachioradialis reflexes are 2+ on the right side and 2+ on the left side.      Patellar reflexes are 2+ on the right side and 2+ on the left side.      Achilles reflexes are 2+ on the right side and 2+ on the left side. Speech is clear and goal oriented, follows commands Normal 5/5 strength in upper and lower extremities bilaterally including dorsiflexion and plantar flexion, strong and equal grip strength Sensation normal to light and sharp touch Moves extremities without ataxia, coordination intact Normal gait Normal balance No Clonus Skin: Skin is warm and dry. No rash noted or lesions noted. Pt is not diaphoretic. No erythema,  ecchymosis,edema or warmth.  Psychiatric: Pt has a normal mood and affect. Behavior is normal.  Nursing note and vitals reviewed. ED Results / Procedures / Treatments   Labs (all labs ordered are listed, but only abnormal results are displayed) Labs Reviewed - No data to display  EKG None  Radiology DG Lumbar Spine Complete  Result Date: 05/15/2021 CLINICAL DATA:  Back pain for the past 2 weeks. Original injury was approximately 2 months ago while moving furniture. EXAM: LUMBAR SPINE - COMPLETE 4+ VIEW COMPARISON:  None. FINDINGS: There are 5 non rib-bearing lumbar type vertebral bodies. Mild straightening expected lumbar lordosis. No anterolisthesis or retrolisthesis. No definite pars defects. Lumbar vertebral body heights appear preserved. Mild-to-moderate multilevel  lumbar spine DDD worse at L4-L5 and L5-S1 with disc space height loss, endplate irregularity and sclerosis. Limited visualization of the bilateral SI joints and hips is normal. Regional bowel gas pattern and soft tissues. IMPRESSION: 1. Mild straightening of the expected lumbar lordosis, nonspecific though could be seen in the setting of muscle spasm. Otherwise, no acute findings. 2. Mild to moderate multilevel lumbar spine DDD worse at L4-L5 and L5-S1 Electronically Signed   By: Simonne Come M.D.   On: 05/15/2021 14:37    Procedures Procedures   Medications Ordered in ED Medications  predniSONE (DELTASONE) tablet 60 mg (60 mg Oral Given 05/15/21 1430)  methocarbamol (ROBAXIN) tablet 500 mg (500 mg Oral Given 05/15/21 1431)  oxyCODONE-acetaminophen (PERCOCET/ROXICET) 5-325 MG per tablet 1 tablet (1 tablet Oral Given 05/15/21 1431)    ED Course  I have reviewed the triage vital signs and the nursing notes.  Pertinent labs & imaging results that were available during my care of the patient were reviewed by me and considered in my medical decision making (see chart for details).   Patient here for evaluation of back pain over  the last 3 weeks after moving.  He has radicular symptoms going down his left leg.  No history of IV drug use, bowel or bladder incontinence, saddle paresthesia.  Pain worse with movement.  I am able to reproduce his pain.  He has no overlying skin changes.  He is afebrile, nonseptic, non-ill-appearing.  Initially seen by PCP prescribed tramadol, muscle relaxers.  Initially helped however he is now off his tramadol.  Was previously seen by provider at [facility] neurosurgery however he does not remember the name of this provider.  X-ray here shows degenerative changes.  Pain controlled.  Will treat with pain control, lidocaine patches, steroids for sciatica. Discussed close follow-up with PCP, if symptoms unresolved may possibly need MRI outpatient.  Do not feel he needs MRI emergently at this time.  Low suspicion for acute neurosurgical emergency such as cauda equina, discitis, osteomyelitis, transverse myelitis, psoas abscess.  He will return for any worsening symptoms.  The patient has been appropriately medically screened and/or stabilized in the ED. I have low suspicion for any other emergent medical condition which would require further screening, evaluation or treatment in the ED or require inpatient management.  Patient is hemodynamically stable and in no acute distress.  Patient able to ambulate in department prior to ED.  Evaluation does not show acute pathology that would require ongoing or additional emergent interventions while in the emergency department or further inpatient treatment.  I have discussed the diagnosis with the patient and answered all questions.  Pain is been managed while in the emergency department and patient has no further complaints prior to discharge.  Patient is comfortable with plan discussed in room and is stable for discharge at this time.  I have discussed strict return precautions for returning to the emergency department.  Patient was encouraged to follow-up  with PCP/specialist refer to at discharge.   MDM Rules/Calculators/A&P                           Final Clinical Impression(s) / ED Diagnoses Final diagnoses:  Acute left-sided low back pain with left-sided sciatica    Rx / DC Orders ED Discharge Orders         Ordered    oxyCODONE (ROXICODONE) 5 MG immediate release tablet  Every 4 hours PRN  05/15/21 1614    predniSONE (DELTASONE) 50 MG tablet  Daily        05/15/21 1614    lidocaine (LIDODERM) 5 %  Every 24 hours        05/15/21 1614           Apollos Tenbrink A, PA-C 05/15/21 1617    Milagros Loll, MD 05/16/21 [phone]

## 2021-05-15 NOTE — ED Notes (Signed)
Back pain started 5/25 no injury. Describes it as sciatica with pain going down left leg Saw pcp last Friday and was given pain meds and muscle relaxants with some relief.   Pt states he had back sx in 2005

## 2021-05-15 NOTE — ED Triage Notes (Signed)
C/o back pain x 14 days with hx of same , seen by PMD x 6 days ago , and is out of pain meds

## 2021-11-07 ENCOUNTER — Telehealth (HOSPITAL_COMMUNITY): Payer: Self-pay

## 2021-11-07 ENCOUNTER — Inpatient Hospital Stay: Admission: EM | Admit: 2021-11-07 | Payer: BC Managed Care – PPO | Source: Ambulatory Visit | Admitting: Neurosurgery

## 2021-11-07 ENCOUNTER — Other Ambulatory Visit (HOSPITAL_COMMUNITY): Payer: Self-pay | Admitting: Neurosurgery

## 2021-11-07 DIAGNOSIS — M4646 Discitis, unspecified, lumbar region: Secondary | ICD-10-CM

## 2021-11-07 NOTE — Telephone Encounter (Signed)
-----   Message from Baldemar Lenis, MD sent at 11/07/2021  3:38 PM EST ----- Regarding: RE: Disc biopsy  Yes, you can schedule, thanks.  ----- Message ----- From: Anderson Malta Sent: 11/07/2021   3:14 PM EST To: Baldemar Lenis, MD, # Subject: Disc biopsy                                    Kat,   New order from Dr. Franky Macho for a L5-S1 disc biopsy due to discitis. You should be able to view the images in inteleviewer. Can you review this and let me know if it is ok to schedule?  Thanks,  Fara Boros

## 2021-11-11 ENCOUNTER — Other Ambulatory Visit: Payer: Self-pay | Admitting: Radiology

## 2021-11-12 ENCOUNTER — Ambulatory Visit (HOSPITAL_COMMUNITY)
Admission: RE | Admit: 2021-11-12 | Discharge: 2021-11-12 | Disposition: A | Discharge status: Home / Self Care | Payer: BC Managed Care – PPO | Source: Ambulatory Visit | Attending: Neurosurgery | Admitting: Neurosurgery

## 2021-11-12 ENCOUNTER — Encounter (HOSPITAL_COMMUNITY): Payer: Self-pay

## 2021-11-12 ENCOUNTER — Other Ambulatory Visit: Payer: Self-pay

## 2021-11-12 DIAGNOSIS — M4646 Discitis, unspecified, lumbar region: Secondary | ICD-10-CM | POA: Diagnosis present

## 2021-11-12 HISTORY — PX: IR FLUORO GUIDED NEEDLE PLC ASPIRATION/INJECTION LOC: IMG2395

## 2021-11-12 LAB — CBC
HCT: 44.2 % (ref 39.0–52.0)
Hemoglobin: 14.2 g/dL (ref 13.0–17.0)
MCH: 29.2 pg (ref 26.0–34.0)
MCHC: 32.1 g/dL (ref 30.0–36.0)
MCV: 90.9 fL (ref 80.0–100.0)
Platelets: 260 10*3/uL (ref 150–400)
RBC: 4.86 MIL/uL (ref 4.22–5.81)
RDW: 12.9 % (ref 11.5–15.5)
WBC: 8.7 10*3/uL (ref 4.0–10.5)
nRBC: 0 % (ref 0.0–0.2)

## 2021-11-12 LAB — PROTIME-INR
INR: 1 (ref 0.8–1.2)
Prothrombin Time: 13.3 seconds (ref 11.4–15.2)

## 2021-11-12 MED ORDER — BUPIVACAINE HCL (PF) 0.5 % IJ SOLN
INTRAMUSCULAR | Status: AC
  Filled 2021-11-12: qty 30

## 2021-11-12 MED ORDER — CEFAZOLIN SODIUM-DEXTROSE 2-4 GM/100ML-% IV SOLN
INTRAVENOUS | Status: AC | PRN
  Administered 2021-11-12: 2 g via INTRAVENOUS

## 2021-11-12 MED ORDER — LIDOCAINE HCL (PF) 1 % IJ SOLN
INTRAMUSCULAR | Status: DC | PRN
  Administered 2021-11-12: 10 mL

## 2021-11-12 MED ORDER — FENTANYL CITRATE (PF) 100 MCG/2ML IJ SOLN
INTRAMUSCULAR | Status: AC | PRN
  Administered 2021-11-12: 25 ug via INTRAVENOUS
  Administered 2021-11-12: 50 ug via INTRAVENOUS

## 2021-11-12 MED ORDER — SODIUM CHLORIDE 0.9 % IV SOLN: INTRAVENOUS | Status: DC

## 2021-11-12 MED ORDER — MIDAZOLAM HCL 2 MG/2ML IJ SOLN
INTRAMUSCULAR | Status: AC
  Filled 2021-11-12: qty 2

## 2021-11-12 MED ORDER — LIDOCAINE HCL 1 % IJ SOLN
INTRAMUSCULAR | Status: AC
  Filled 2021-11-12: qty 20

## 2021-11-12 MED ORDER — BUPIVACAINE HCL (PF) 0.5 % IJ SOLN
INTRAMUSCULAR | Status: DC | PRN
  Administered 2021-11-12: 5 mL

## 2021-11-12 MED ORDER — FENTANYL CITRATE (PF) 100 MCG/2ML IJ SOLN
INTRAMUSCULAR | Status: AC
  Filled 2021-11-12: qty 2

## 2021-11-12 MED ORDER — CEFAZOLIN SODIUM-DEXTROSE 2-4 GM/100ML-% IV SOLN
INTRAVENOUS | Status: AC
  Filled 2021-11-12: qty 100

## 2021-11-12 MED ORDER — MIDAZOLAM HCL 2 MG/2ML IJ SOLN
INTRAMUSCULAR | Status: AC | PRN
  Administered 2021-11-12: .5 mg via INTRAVENOUS
  Administered 2021-11-12: 1 mg via INTRAVENOUS

## 2021-11-12 NOTE — Progress Notes (Signed)
Beckey Downing, PA notified of patients blood pressure and aware.

## 2021-11-12 NOTE — H&P (Signed)
Chief Complaint: Patient was seen in consultation today for L5-S1 disc aspiration at the request of [facility]  Referring Physician(s): Coletta Memos  Supervising Physician: Baldemar Lenis  Patient Status: [facility] - Out-pt  History of Present Illness: Dillon Santiago is a 50 y.o. male   Hx back surgery/laminectomy 06/2021- Dr Franky Macho Did well for few months Developed spasms in back and pain 08/2021 Ibuprofen and pain meds--no relief  MRI 11/06/21 IMPRESSION: 1. Findings most concerning for discitis-osteomyelitis at L5-S1 with a paravertebral phlegmon. No drainable fluid collection to suggest an abscess. Ventral epidural enhancing soft tissue at L4 and L5 likely reflecting an epidural phlegmon which extends along the left lateral aspect of the thecal sac towards the laminectomy site. 2. At L4-5 there is a broad-based disc bulge eccentric towards the left. Mild bilateral facet arthropathy. Mild spinal stenosis. Mild left foraminal stenosis. No right foraminal stenosis. Bilateral lateral recess stenosis.  Pt is now scheduled for L5-S1 disc aspiration  Past Medical History:  Diagnosis Date   Back pain     Past Surgical History:  Procedure Laterality Date   KNEE ARTHROSCOPY W/ ACL RECONSTRUCTION AND PATELLA GRAFT     LUMBAR DISC SURGERY      Allergies: Patient has no known allergies.  Medications: Prior to Admission medications   Medication Sig Start Date End Date Taking? Authorizing Provider  HYDROcodone-acetaminophen (NORCO/VICODIN) 5-325 MG tablet Take 1 tablet by mouth every 6 (six) hours as needed for pain. 10/25/21  Yes [provider]  ibuprofen (ADVIL) 200 MG tablet Take 400-600 mg by mouth every 6 (six) hours as needed for headache or mild pain.   Yes [provider]  lisinopril (ZESTRIL) 20 MG tablet Take 20 mg by mouth daily. 09/27/21  Yes [provider]  omeprazole (PRILOSEC) 40 MG capsule Take 40 mg by mouth  daily with breakfast. 10/14/21  Yes [provider]  tiZANidine (ZANAFLEX) 4 MG tablet Take 4 mg by mouth 3 (three) times daily. 11/01/21  Yes [provider]     Family History  Problem Relation Age of Onset   Kidney failure Father    Congestive Heart Failure Father     Social History   Socioeconomic History   Marital status: Married    Spouse name: Not on file   Number of children: Not on file   Years of education: Not on file   Highest education level: Not on file  Occupational History   Not on file  Tobacco Use   Smoking status: Every Day   Smokeless tobacco: Never  Substance and Sexual Activity   Alcohol use: Yes    Comment: socially   Drug use: No   Sexual activity: Not on file  Other Topics Concern   Not on file  Social History Narrative   Not on file   Social Determinants of Health   Financial Resource Strain: Not on file  Food Insecurity: Not on file  Transportation Needs: Not on file  Physical Activity: Not on file  Stress: Not on file  Social Connections: Not on file     Review of Systems: A 12 point ROS discussed and pertinent positives are indicated in the HPI above.  All other systems are negative.  Review of Systems  Constitutional:  Positive for activity change. Negative for fatigue and fever.  Respiratory:  Negative for cough and shortness of breath.   Cardiovascular:  Negative for chest pain.  Gastrointestinal:  Negative for abdominal pain.  Musculoskeletal:  Positive  for back pain and gait problem.  Neurological:  Negative for weakness.  Psychiatric/Behavioral:  Negative for behavioral problems and confusion.    Vital Signs: BP (!) 168/109   Pulse 86   Temp 98.3 F (36.8 C) (Oral)   Resp 12   Ht 5\' 10"  (1.778 m)   Wt 250 lb (113.4 kg)   SpO2 97%   BMI 35.87 kg/m   Physical Exam Vitals reviewed.  HENT:     Mouth/Throat:     Mouth: Mucous membranes are moist.  Cardiovascular:     Rate and Rhythm: Normal rate  and regular rhythm.     Heart sounds: Normal heart sounds.  Pulmonary:     Effort: Pulmonary effort is normal.     Breath sounds: Normal breath sounds.  Abdominal:     Palpations: Abdomen is soft.     Tenderness: There is no abdominal tenderness.  Musculoskeletal:        General: Normal range of motion.     Comments: Low back pain  Skin:    General: Skin is warm.  Neurological:     Mental Status: He is alert and oriented to person, place, and time.  Psychiatric:        Behavior: Behavior normal.    Imaging: No results found.  Labs:  CBC: No results for input(s): WBC, HGB, HCT, PLT in the last 8760 hours.  COAGS: No results for input(s): INR, APTT in the last 8760 hours.  BMP: No results for input(s): NA, K, CL, CO2, GLUCOSE, BUN, CALCIUM, CREATININE, GFRNONAA, GFRAA in the last 8760 hours.  Invalid input(s): CMP  LIVER FUNCTION TESTS: No results for input(s): BILITOT, AST, ALT, ALKPHOS, PROT, ALBUMIN in the last 8760 hours.  TUMOR MARKERS: No results for input(s): AFPTM, CEA, CA199, CHROMGRNA in the last 8760 hours.  Assessment and Plan:  Low back pain Post laminectomy 06/2021 MRI revealing discitis Scheduled for disc aspiration L5-S1 Risks and benefits of L5-S1 disc aspiration was discussed with the patient and/or patient's family including, but not limited to bleeding, infection, damage to adjacent structures or low yield requiring additional tests.  All of the questions were answered and there is agreement to proceed.  Consent signed and in chart.   Thank you for this interesting consult.  I greatly enjoyed meeting Dillon Santiago and look forward to participating in their care.  A copy of this report was sent to the requesting provider on this date.  Electronically Signed: Renelda Mom, PA-C 11/12/2021, 10:16 AM   I spent a total of  30 Minutes   in face to face in clinical consultation, greater than 50% of which was counseling/coordinating care for  L5-S1 disc aspiration

## 2021-11-12 NOTE — Procedures (Signed)
INTERVENTIONAL NEURORADIOLOGY BRIEF POSTPROCEDURE NOTE  FLUOROSCOPY GUIDED L5-S1 DISC ASPIRATION    Attending: Dr. Baldemar Lenis   Assistant: None.    Diagnosis: L5-S1 discitis/osteomyelitis.    Access site: Percutaneous.    Anesthesia: Moderate sedation.    Medication used: 1.5 mg Versed IV; 75 mcg Fentanyl IV.   Complications: None.    Estimated blood loss: None.    Specimen: 2 fine-needle aspiration samples sent for Gram stain and culture.    Percutaneous right posterolateral approach utilized for fine-needle aspiration of the L5-S1 intervertebral disc.  Two samples of bloody fluid were collected into syringes and sent to the laboratory.    The patient tolerated the procedure well without incident or complication and is in stable condition.

## 2021-11-22 LAB — AEROBIC/ANAEROBIC CULTURE W GRAM STAIN (SURGICAL/DEEP WOUND): Gram Stain: NONE SEEN

## 2021-12-03 LAB — CULTURE, FUNGUS WITHOUT SMEAR
